# Patient Record
Sex: Female | Born: 1955 | Race: White | Hispanic: No | Smoking: Never smoker
Health system: Southern US, Community
[De-identification: ages and names within clinical notes are randomized; demographics above are authoritative.]

## PROBLEM LIST (undated history)

## (undated) DIAGNOSIS — K219 Gastro-esophageal reflux disease without esophagitis: Secondary | ICD-10-CM

## (undated) DIAGNOSIS — R519 Headache, unspecified: Secondary | ICD-10-CM

## (undated) DIAGNOSIS — C801 Malignant (primary) neoplasm, unspecified: Secondary | ICD-10-CM

## (undated) DIAGNOSIS — J349 Unspecified disorder of nose and nasal sinuses: Secondary | ICD-10-CM

## (undated) DIAGNOSIS — F32A Depression, unspecified: Secondary | ICD-10-CM

## (undated) DIAGNOSIS — R609 Edema, unspecified: Secondary | ICD-10-CM

## (undated) DIAGNOSIS — R233 Spontaneous ecchymoses: Secondary | ICD-10-CM

## (undated) DIAGNOSIS — R51 Headache: Secondary | ICD-10-CM

## (undated) DIAGNOSIS — H9190 Unspecified hearing loss, unspecified ear: Secondary | ICD-10-CM

## (undated) DIAGNOSIS — K259 Gastric ulcer, unspecified as acute or chronic, without hemorrhage or perforation: Secondary | ICD-10-CM

## (undated) DIAGNOSIS — F419 Anxiety disorder, unspecified: Secondary | ICD-10-CM

## (undated) DIAGNOSIS — R238 Other skin changes: Secondary | ICD-10-CM

## (undated) DIAGNOSIS — M791 Myalgia, unspecified site: Secondary | ICD-10-CM

## (undated) DIAGNOSIS — I10 Essential (primary) hypertension: Secondary | ICD-10-CM

## (undated) DIAGNOSIS — F329 Major depressive disorder, single episode, unspecified: Secondary | ICD-10-CM

## (undated) DIAGNOSIS — R1310 Dysphagia, unspecified: Secondary | ICD-10-CM

## (undated) DIAGNOSIS — M1990 Unspecified osteoarthritis, unspecified site: Secondary | ICD-10-CM

## (undated) HISTORY — DX: Gastric ulcer, unspecified as acute or chronic, without hemorrhage or perforation: K25.9

## (undated) HISTORY — DX: Dysphagia, unspecified: R13.10

## (undated) HISTORY — DX: Gastro-esophageal reflux disease without esophagitis: K21.9

## (undated) HISTORY — DX: Essential (primary) hypertension: I10

## (undated) HISTORY — DX: Spontaneous ecchymoses: R23.3

## (undated) HISTORY — DX: Myalgia, unspecified site: M79.10

## (undated) HISTORY — DX: Edema, unspecified: R60.9

## (undated) HISTORY — DX: Unspecified osteoarthritis, unspecified site: M19.90

## (undated) HISTORY — DX: Malignant (primary) neoplasm, unspecified: C80.1

## (undated) HISTORY — PX: HEMORRHOID SURGERY: SHX153

## (undated) HISTORY — DX: Headache, unspecified: R51.9

## (undated) HISTORY — DX: Unspecified disorder of nose and nasal sinuses: J34.9

## (undated) HISTORY — DX: Anxiety disorder, unspecified: F41.9

## (undated) HISTORY — PX: ABDOMINAL HYSTERECTOMY: SHX81

## (undated) HISTORY — PX: ESOPHAGUS SURGERY: SHX626

## (undated) HISTORY — DX: Other skin changes: R23.8

## (undated) HISTORY — DX: Headache: R51

## (undated) HISTORY — DX: Major depressive disorder, single episode, unspecified: F32.9

## (undated) HISTORY — DX: Unspecified hearing loss, unspecified ear: H91.90

## (undated) HISTORY — DX: Depression, unspecified: F32.A

## (undated) HISTORY — PX: CHOLECYSTECTOMY: SHX55

---

## 2002-06-03 ENCOUNTER — Inpatient Hospital Stay (HOSPITAL_COMMUNITY): Admission: EM | Admit: 2002-06-03 | Discharge: 2002-06-08 | Payer: Self-pay | Admitting: Psychiatry

## 2011-03-26 DEATH — deceased

## 2013-10-27 ENCOUNTER — Other Ambulatory Visit: Payer: Self-pay | Admitting: Internal Medicine

## 2013-10-27 DIAGNOSIS — M5412 Radiculopathy, cervical region: Secondary | ICD-10-CM

## 2013-11-03 ENCOUNTER — Other Ambulatory Visit: Payer: Self-pay

## 2014-03-30 DIAGNOSIS — M4726 Other spondylosis with radiculopathy, lumbar region: Secondary | ICD-10-CM | POA: Insufficient documentation

## 2014-03-30 DIAGNOSIS — I10 Essential (primary) hypertension: Secondary | ICD-10-CM | POA: Insufficient documentation

## 2014-03-30 DIAGNOSIS — M5412 Radiculopathy, cervical region: Secondary | ICD-10-CM | POA: Insufficient documentation

## 2014-03-30 DIAGNOSIS — M542 Cervicalgia: Secondary | ICD-10-CM | POA: Insufficient documentation

## 2014-03-30 DIAGNOSIS — K219 Gastro-esophageal reflux disease without esophagitis: Secondary | ICD-10-CM | POA: Insufficient documentation

## 2014-04-07 DIAGNOSIS — M545 Low back pain, unspecified: Secondary | ICD-10-CM | POA: Insufficient documentation

## 2014-04-07 DIAGNOSIS — F309 Manic episode, unspecified: Secondary | ICD-10-CM | POA: Insufficient documentation

## 2014-04-07 DIAGNOSIS — M76829 Posterior tibial tendinitis, unspecified leg: Secondary | ICD-10-CM | POA: Insufficient documentation

## 2014-04-07 DIAGNOSIS — G43909 Migraine, unspecified, not intractable, without status migrainosus: Secondary | ICD-10-CM | POA: Insufficient documentation

## 2014-04-07 DIAGNOSIS — IMO0002 Reserved for concepts with insufficient information to code with codable children: Secondary | ICD-10-CM | POA: Insufficient documentation

## 2014-06-06 DIAGNOSIS — M4722 Other spondylosis with radiculopathy, cervical region: Secondary | ICD-10-CM | POA: Insufficient documentation

## 2014-09-01 ENCOUNTER — Ambulatory Visit: Payer: Medicare HMO | Admitting: Podiatry

## 2014-09-01 ENCOUNTER — Ambulatory Visit (INDEPENDENT_AMBULATORY_CARE_PROVIDER_SITE_OTHER): Payer: Medicare HMO

## 2014-09-01 VITALS — BP 164/102 | HR 93 | Resp 18

## 2014-09-01 DIAGNOSIS — M25572 Pain in left ankle and joints of left foot: Secondary | ICD-10-CM

## 2014-09-01 DIAGNOSIS — M7752 Other enthesopathy of left foot: Secondary | ICD-10-CM | POA: Diagnosis not present

## 2014-09-01 DIAGNOSIS — R52 Pain, unspecified: Secondary | ICD-10-CM | POA: Diagnosis not present

## 2014-09-01 DIAGNOSIS — S93402A Sprain of unspecified ligament of left ankle, initial encounter: Secondary | ICD-10-CM | POA: Diagnosis not present

## 2014-09-01 DIAGNOSIS — M7672 Peroneal tendinitis, left leg: Secondary | ICD-10-CM

## 2014-09-01 MED ORDER — TRIAMCINOLONE ACETONIDE 10 MG/ML IJ SUSP: 10.0000 mg | Freq: Once | INTRAMUSCULAR | Status: AC

## 2014-09-01 NOTE — Patient Instructions (Signed)
ICE INSTRUCTIONS  Apply ice or cold pack to the affected area at least 3 times a day for 10-15 minutes each time.  You should also use ice after prolonged activity or vigorous exercise.  Do not apply ice longer than 20 minutes at one time.  Always keep a cloth between your skin and the ice pack to prevent burns.  Being consistent and following these instructions will help control your symptoms.  We suggest you purchase a gel ice pack because they are reusable and do bit leak.  Some of them are designed to wrap around the area.  Use the method that works best for you.  Here are some other suggestions for icing.   Use a frozen bag of peas or corn-inexpensive and molds well to your body, usually stays frozen for 10 to 20 minutes.  Wet a towel with cold water and squeeze out the excess until it's damp.  Place in a bag in the freezer for 20 minutes. Then remove and use.   Alternate applying a high compress and ice pack to the ankle 2 or 3 times every evening repeat heat for 10 minutes then ice for 10 minutes heat and ice again

## 2014-09-01 NOTE — Progress Notes (Signed)
Subjective:    Patient ID: Misty Roberts, female    DOB: 11-Nov-1955, 59 y.o.   MRN: [id number]  HPI I AM HAVING SOME ANKLE PAIN AND STARTED IN EARLY November AND HAS SWELLING AND PAIN GOING UP AND DOWN AND HAVE USED ICE AND HEAT NO INJURY AND BURNS AND THROBS,STINGS AND FEELS LIKE I AM NOT WALKING IN THE RIGHT POSITION AND FEELS TWISTED AND HAD EX-RAYS DONE AT [place] YESTERDAY    Review of Systems  Constitutional: Positive for activity change.  HENT: Positive for hearing loss.   Eyes: Positive for visual disturbance.  Cardiovascular:       CALF PAIN WITH WALKING   Gastrointestinal: Positive for constipation and blood in stool.       BLOATING   Musculoskeletal: Positive for back pain and gait problem.       JOINT PAIN   Neurological: Positive for dizziness.  Psychiatric/Behavioral: Positive for confusion. The patient is nervous/anxious.   All other systems reviewed and are negative.      Objective:   Physical Exam 59 year old white female well-developed well-nourished oriented 3 presents at this time with a history of chronic or recalcitrant left ankle pain. Describes sharp and shooting pain at times tight feeling around the lateral malleoli are lateral ankle extending to the fifth metatarsal base. She's using the ice does remember any injury however has burning and tingling stinging and throbbing sensations that time previous x-rays were also likely unremarkable. Lower extremity objective findings as follows vascular status is intact pedal pulses palpable DP +2 over 4 bilateral PT plus one over 4 bilateral capillary refill time 3 seconds all digits epicritic and proprioceptive sensations intact and symmetric bilateral there is normal plantar response and DTRs. Dermatologically skin color pigment normal hair growth absent nails criptotic incurvated otherwise unremarkable there is some pain tenderness on palpation lateral sinus tarsi area lateral malleolar area of the left  foot right foot is a symptomatically unremarkable there is posse some soft tissue swelling in the sinus tarsi on palpation tenderness along the anterior talofibular and calcaneofibular ligament as well as along the peroneal tendon distribution down to its insertion in the base the fifth. X-rays reveal no fracture no signs of osseous abnormality no cyst or tumors subtalar joint intact posterior middle facet on lateral projection AP and mortise ankle views unremarkable symmetric joint spaces are noted no fractures no cysts no tumors no erosions or identified. Cannot rule out soft tissue injury. Patient denies any ankle sprain however is wearing a pair flimsy worn out shoes and likely has not been having a stable shoe may have turned her ankle in the recent past. It does feel like it's twisted according to the patient       Assessment & Plan:  Assessment this time is capsulitis and sinus tarsitis lateral sinus tarsi and subtalar joint left foot the ankle mortise itself is not painful is more in the sinus tarsi area also at this time injection tender with Kenalog 20 mg Xylocaine plain infiltrated the sinus tarsi subtalar joint left foot and ankle under topical anesthetic applied ethyl chloride injection was delivered to the sinus tarsi with minimal discomfort patient does have pain on inversion eversion in particular plantar flexion and inversion of the ankle and sinus tarsi area patient this time is been placed in air fracture third ankle stabilizer to help provide some lateral ankle stability maintain this for at least 4-5 weeks follow-up with in one month for reevaluation and check for progress.  She is advised to good stable shoe such as new balance Brooks or ASICS no barefoot no flimsy shoes no flip-flops recommend crocs for around the house as far as pain patient does have Tylenol and access to tramadol which was recently prescribed for her. Assessment 1 month  Harriet Masson DPM

## 2014-10-06 ENCOUNTER — Ambulatory Visit (INDEPENDENT_AMBULATORY_CARE_PROVIDER_SITE_OTHER): Payer: Medicare HMO

## 2014-10-06 VITALS — BP 114/88 | HR 69 | Resp 12

## 2014-10-06 DIAGNOSIS — M7752 Other enthesopathy of left foot: Secondary | ICD-10-CM

## 2014-10-06 DIAGNOSIS — M25572 Pain in left ankle and joints of left foot: Secondary | ICD-10-CM

## 2014-10-06 DIAGNOSIS — S93402A Sprain of unspecified ligament of left ankle, initial encounter: Secondary | ICD-10-CM

## 2014-10-06 DIAGNOSIS — R52 Pain, unspecified: Secondary | ICD-10-CM

## 2014-10-06 DIAGNOSIS — M7672 Peroneal tendinitis, left leg: Secondary | ICD-10-CM

## 2014-10-06 MED ORDER — MELOXICAM 15 MG PO TABS: 15.0000 mg | ORAL_TABLET | Freq: Every day | ORAL | Status: DC

## 2014-10-06 NOTE — Progress Notes (Signed)
   Subjective:    Patient ID: Misty Roberts, female    DOB: Nov 23, 1955, 59 y.o.   MRN: [id number]  HPI  ''lt foot ankle is still painful and have burning sensation.''  Review of Systems no new findings or systemic changes noted     Objective:   Physical Exam Neurovascular status is intact pedal pulses are palpable patient is currently pulling sensation in the lateral ankle along the peroneal tendon and 10  Insertion still tender as is the sinus tarsi area. Likely chronic ankle sprain ankle instability type situation at this time patient also may have some neuritis or neuralgia which is contributing to symptoms however this time a recommendation is to use an ankle stabilizer when needed or maintain a good shoe currently wearing a very flimsy type shoe recommended a lace up athletic or walking shoe at all times consider even a high top type shoe. Per prescription for meloxicam is issued at this time and my recommendation is to consider physical therapy to help strengthen the ankle and tendon and peroneal tendons.       Assessment & Plan:  Assessment capsulitis lateral ankle sinus tarsitis with the peroneal tendinitis chronic ankle instability. Plan at this time patient is sent for physical therapy for 3 times a week for at least 3 weeks also will continue with ankle stabilizer as needed prescription for meloxicam is given for any breakthrough pain follow-up within 1-2 months for additional evaluation if not improved are resolving next  Harriet Masson DPM

## 2014-10-06 NOTE — Patient Instructions (Signed)
ICE INSTRUCTIONS  Apply ice or cold pack to the affected area at least 3 times a day for 10-15 minutes each time.  You should also use ice after prolonged activity or vigorous exercise.  Do not apply ice longer than 20 minutes at one time.  Always keep a cloth between your skin and the ice pack to prevent burns.  Being consistent and following these instructions will help control your symptoms.  We suggest you purchase a gel ice pack because they are reusable and do bit leak.  Some of them are designed to wrap around the area.  Use the method that works best for you.  Here are some other suggestions for icing.   Use a frozen bag of peas or corn-inexpensive and molds well to your body, usually stays frozen for 10 to 20 minutes.  Wet a towel with cold water and squeeze out the excess until it's damp.  Place in a bag in the freezer for 20 minutes. Then remove and use.  Maintain ankle stabilizer or high top type shoe. Follow-up with physical therapy as prescribed

## 2014-12-07 ENCOUNTER — Ambulatory Visit (INDEPENDENT_AMBULATORY_CARE_PROVIDER_SITE_OTHER): Payer: Medicare HMO

## 2014-12-07 VITALS — BP 145/86 | HR 80 | Resp 18

## 2014-12-07 DIAGNOSIS — M25572 Pain in left ankle and joints of left foot: Secondary | ICD-10-CM

## 2014-12-07 DIAGNOSIS — M7752 Other enthesopathy of left foot: Secondary | ICD-10-CM

## 2014-12-07 DIAGNOSIS — M25372 Other instability, left ankle: Secondary | ICD-10-CM

## 2014-12-07 MED ORDER — MELOXICAM 15 MG PO TABS: 15.0000 mg | ORAL_TABLET | Freq: Every day | ORAL | Status: DC

## 2014-12-07 NOTE — Progress Notes (Signed)
Chief Complaint  Patient presents with  . Foot Problem    MY LEFT ANKLE IS DOING ALOT BETTER AND THE MEDICINE HAS HELPED      HPI: Patient is 59 y.o. female who presents today for follow up left ankle pain.  She relates the pain has proved with physical therapy and with the meloxicam medication. She states the pain is still present but it's much better.   No Known Allergies  Physical Exam  Her vascular status is unchanged. Lateral ankle ankle pain still present along the peroneal tendons and there is some swelling along the distal portion of the peroneus brevis tendon at its insertion. Continued pain along the sinus tarsi region is also noted. Pain along the anterior talofibular ligament and calcaneal fibular ligament is also noted with palpation. Chronic ankle instability favored.  Assessment: Sinus tarsi syndrome, chronic lateral ankle instability, peroneal tendon pain   Plan: Injected the sinus tarsi for injection #2 with dexamethasone and Marcaine plain under sterile technique and without complication. Recommended continued physical therapy. Recommended continued use of her ankle stabilizing brace. Refilled her meloxicam. Instructed her to take this with a full meal. She will finish out physical therapy and if there is no improvement at that time she will call. In the future she may be candidate for an ankle stabilization repair and we would get an MRI prior to considering this procedure.

## 2014-12-07 NOTE — Patient Instructions (Signed)
Continue out your physical therapy-- if it is not getting better at that point, please call

## 2014-12-08 ENCOUNTER — Ambulatory Visit: Payer: Medicare HMO

## 2015-03-30 DIAGNOSIS — M25819 Other specified joint disorders, unspecified shoulder: Secondary | ICD-10-CM | POA: Insufficient documentation

## 2015-03-30 DIAGNOSIS — M19011 Primary osteoarthritis, right shoulder: Secondary | ICD-10-CM | POA: Insufficient documentation

## 2017-01-29 DIAGNOSIS — M25521 Pain in right elbow: Secondary | ICD-10-CM | POA: Insufficient documentation

## 2017-01-29 DIAGNOSIS — B999 Unspecified infectious disease: Secondary | ICD-10-CM | POA: Insufficient documentation

## 2017-02-24 DIAGNOSIS — R9431 Abnormal electrocardiogram [ECG] [EKG]: Secondary | ICD-10-CM

## 2018-12-06 DIAGNOSIS — R002 Palpitations: Secondary | ICD-10-CM | POA: Insufficient documentation

## 2018-12-06 DIAGNOSIS — R0789 Other chest pain: Secondary | ICD-10-CM | POA: Insufficient documentation

## 2018-12-06 DIAGNOSIS — R0609 Other forms of dyspnea: Secondary | ICD-10-CM | POA: Insufficient documentation

## 2018-12-06 DIAGNOSIS — R55 Syncope and collapse: Secondary | ICD-10-CM | POA: Insufficient documentation

## 2019-02-28 ENCOUNTER — Encounter: Payer: Self-pay | Admitting: Neurology

## 2019-03-30 NOTE — Progress Notes (Addendum)
NEUROLOGY CONSULTATION NOTE  Misty Roberts MRN: [id number] DOB: 1956-06-04  Referring provider: Gardiner Rhyme, MD Primary care provider: [facility]  Reason for consult:  vertigo  HISTORY OF PRESENT ILLNESS: Misty Roberts is a 63 year old female with hypertension, chronic pain, sleep apnea intolerant to CPAP) and Bipolar disorder who presents for vertigo.  History supplemented by referring provider note.  Over the past 3 years, she reports episodes of dizziness.  Some are more severe than others.  It's positional.  If she bends over or look up, or laying down in bed, she may experience a spinning sensation, lasting a few seconds.  She has associated nausea.  No associated vomiting, headache, double vision, slurred speech or unilateral numbness or weakness.  It is off and on but the sensation of some type of dizziness is chronic.  No falls.  She has had episodes of palpitations, pounding in her chest, and lightheadedness.  Often the dizziness may occur following these spells.  She had on episode of vertigo in March that caused her to pass out.  She also has endorsed dyspnea on exertion with sensation of chest tightness and sometimes becomes short of breath just walking up the stairs.  She was evaluated by cardiology and underwent cardiac workup.  TTE was normal with LV EF 60-65%.  Nuclear stress test and 14 day Zio patch but will not know the results until she follows up with cardiology.  She has been to vestibular rehab, however she has difficulty performing the Epley maneuver due to chronic neck pain.  PAST MEDICAL HISTORY: Past Medical History:  Diagnosis Date  . Anxiety   . Arthritis   . Bruises easily   . Cancer   . Depression   . Difficulty swallowing   . Frequent headaches   . GERD (gastroesophageal reflux disease)   . Hearing loss   . Hypertension   . Muscle pain   . Sinus problem   . Stomach ulcer   . Swelling    FEET    PAST SURGICAL HISTORY: Past  Surgical History:  Procedure Laterality Date  . ESOPHAGUS SURGERY      MEDICATIONS: Current Outpatient Medications on File Prior to Visit  Medication Sig Dispense Refill  . AMITIZA 24 MCG capsule     . amLODipine (NORVASC) 10 MG tablet     . amoxicillin-clavulanate (AUGMENTIN) 875-125 MG per tablet     . azithromycin (ZITHROMAX) 250 MG tablet     . BRINTELLIX 10 MG TABS     . buPROPion (WELLBUTRIN XL) 150 MG 24 hr tablet     . celecoxib (CELEBREX) 200 MG capsule 200 mg.    . Cholecalciferol (VITAMIN D3) 2000 UNITS capsule Take by mouth.    . clarithromycin (BIAXIN XL) 500 MG 24 hr tablet     . cyclobenzaprine (FLEXERIL) 10 MG tablet Take 10 mg by mouth.    . escitalopram (LEXAPRO) 10 MG tablet     . escitalopram (LEXAPRO) 20 MG tablet     . furosemide (LASIX) 20 MG tablet     . gabapentin (NEURONTIN) 100 MG capsule Take 1 to 3 tablets at night as needed for radicular pain.    Marland Kitchen HYDROcodone-acetaminophen (NORCO/VICODIN) 5-325 MG per tablet Take by mouth.    Marland Kitchen HYDROcodone-ibuprofen (VICOPROFEN) 7.5-200 MG per tablet Take as directed.    Marland Kitchen lisinopril (PRINIVIL,ZESTRIL) 10 MG tablet Take 10 mg by mouth.    Marland Kitchen lisinopril-hydrochlorothiazide (PRINZIDE,ZESTORETIC) 20-25 MG per tablet     .  LORazepam (ATIVAN) 1 MG tablet Take 1 mg by mouth.    Marland Kitchen LORazepam (ATIVAN) 1 MG tablet     . meloxicam (MOBIC) 15 MG tablet Take 1 tablet (15 mg total) by mouth daily. 30 tablet 1  . Omega-3 Fatty Acids (FISH OIL) 1000 MG CAPS Take 1,200 mg by mouth.    Marland Kitchen omeprazole (PRILOSEC) 20 MG capsule Take 10 mg by mouth.    Marland Kitchen omeprazole (PRILOSEC) 20 MG capsule     . predniSONE (DELTASONE) 10 MG tablet     . simvastatin (ZOCOR) 10 MG tablet 40 mg.    . simvastatin (ZOCOR) 40 MG tablet     . traZODone (DESYREL) 100 MG tablet     . triamcinolone cream (KENALOG) 0.1 %     . VESICARE 5 MG tablet      Current Facility-Administered Medications on File Prior to Visit  Medication Dose Route Frequency Provider Last  Rate Last Dose  . triamcinolone acetonide (KENALOG) 10 MG/ML injection 10 mg  10 mg Other Once Harriet Masson, DPM        ALLERGIES: No Known Allergies  FAMILY HISTORY: Family History  Problem Relation Age of Onset  . Bone cancer Mother   . Congestive Heart Failure Father   . Prostate cancer Father   . Dementia Father   . Lung cancer Sister   . Diabetes Brother   . Coronary artery disease Brother   . Pneumonia Sister   . Hypertension Sister   . High Cholesterol Sister   . Hypertension Sister   . High Cholesterol Sister   . Hypertension Sister   . High Cholesterol Sister   . Hypertension Sister   . High Cholesterol Sister   . Hypertension Sister   . High Cholesterol Sister   . Atrial fibrillation Brother   . Heart attack Brother     SOCIAL HISTORY: Social History   Socioeconomic History  . Marital status: Married    Spouse name: Not on file  . Number of children: Not on file  . Years of education: Not on file  . Highest education level: Not on file  Occupational History  . Not on file  Social Needs  . Financial resource strain: Not on file  . Food insecurity    Worry: Not on file    Inability: Not on file  . Transportation needs    Medical: Not on file    Non-medical: Not on file  Tobacco Use  . Smoking status: Never Smoker  . Smokeless tobacco: Never Used  Substance and Sexual Activity  . Alcohol use: No    Alcohol/week: 0.0 standard drinks  . Drug use: No  . Sexual activity: Not on file  Lifestyle  . Physical activity    Days per week: Not on file    Minutes per session: Not on file  . Stress: Not on file  Relationships  . Social Herbalist on phone: Not on file    Gets together: Not on file    Attends religious service: Not on file    Active member of club or organization: Not on file    Attends meetings of clubs or organizations: Not on file    Relationship status: Not on file  . Intimate partner violence    Fear of current or ex  partner: Not on file    Emotionally abused: Not on file    Physically abused: Not on file    Forced sexual activity: Not on  file  Other Topics Concern  . Not on file  Social History Narrative  . Not on file    REVIEW OF SYSTEMS: Constitutional: No fevers, chills Eyes: No visual changes, double vision, eye pain Ear, nose and throat: No hearing loss, ear pain, nasal congestion, sore throat Cardiovascular: sometimes chest discomfort, palpitations Respiratory:  shortness of breath at rest or with exertion, GastrointestinaI: No nausea, vomiting, diarrhea, abdominal pain, fecal incontinence Genitourinary:  No dysuria, urinary retention or frequency Musculoskeletal:  No neck pain, back pain Integumentary: No rash, pruritus, skin lesions Neurological: as above Psychiatric: No depression, insomnia, anxiety Endocrine: palpitations, fatigue, diaphoresisechiae. Allergic/Immunologic: no itchy/runny eyes, nasal congestion, recent allergic reactions, rashes  PHYSICAL EXAM: Blood pressure 95/63, pulse 75, temperature 98.7 F (37.1 C), temperature source Oral, height 4\' 9"  (1.448 m), weight 136 lb (61.7 kg), SpO2 100 %. General: No acute distress.  Patient appears well-groomed.   Head:  Normocephalic/atraumatic Eyes:  fundi examined but not visualized Neck: supple, no paraspinal tenderness, full range of motion Back: No paraspinal tenderness Heart: regular rate and rhythm Lungs: Clear to auscultation bilaterally. Vascular: No carotid bruits. Neurological Exam: Mental status: alert and oriented to person, place, and time, recent and remote memory intact, fund of knowledge intact, attention and concentration intact, speech fluent and not dysarthric, language intact. Cranial nerves: CN I: not tested CN II: pupils equal, round and reactive to light, visual fields intact CN III, IV, VI:  full range of motion, no nystagmus, no ptosis CN V: facial sensation intact CN VII: upper and lower face  symmetric CN VIII: hearing intact CN IX, X: gag intact, uvula midline CN XI: sternocleidomastoid and trapezius muscles intact CN XII: tongue midline Bulk & Tone: normal, no fasciculations. Motor:  5/5 throughout  Sensation:  temperature and vibration sensation intact.   Deep Tendon Reflexes:  2+ throughout, toes downgoing.  Finger to nose testing:  Without dysmetria.  Heel to shin:  Without dysmetria.   Gait:  Normal station and stride.  Able to turn and tandem walk. Romberg negative.  IMPRESSION: Positional vertigo, semiology consistent with BPPV.  No focal/laterazing associated symptoms.  She also has had palpitations and lightheadedness, which may be contributing.  Blood pressure is low.  Symptoms are chronic and therefore not likely secondary to a CVA.  However, given the severity of some of these spells, I would like to rule out vertebrobasilar insufficiency.  PLAN: CTA of head and neck.  If unremarkable, no further recommendations.  04/15/19 ADDENDUM:  CTA of head and neck unremarkable.  Thank you for allowing me to take part in the care of this patient.  Metta Clines, DO

## 2019-03-31 ENCOUNTER — Encounter: Payer: Self-pay | Admitting: Neurology

## 2019-03-31 ENCOUNTER — Ambulatory Visit (INDEPENDENT_AMBULATORY_CARE_PROVIDER_SITE_OTHER): Payer: Medicare HMO | Admitting: Neurology

## 2019-03-31 ENCOUNTER — Other Ambulatory Visit: Payer: Self-pay

## 2019-03-31 VITALS — BP 95/63 | HR 75 | Temp 98.7°F | Ht <= 58 in | Wt 136.0 lb

## 2019-03-31 DIAGNOSIS — R42 Dizziness and giddiness: Secondary | ICD-10-CM | POA: Diagnosis not present

## 2019-03-31 DIAGNOSIS — R55 Syncope and collapse: Secondary | ICD-10-CM | POA: Diagnosis not present

## 2019-03-31 NOTE — Patient Instructions (Addendum)
Dizziness likely related to inner ear issues and possibly low blood pressure.  However, we will check the brain and blood vessels (CTA of head and neck) to look for any other potential cause.  Further recommendations pending results.  We have sent a referral to [facility] for your CTA's and they will call you directly to schedule your appointment.   They are located at [place]. If you need to contact them, or you have not received a call to schedule an appointment within 5-7 days,  please call [phone].

## 2019-04-04 DIAGNOSIS — I950 Idiopathic hypotension: Secondary | ICD-10-CM | POA: Insufficient documentation

## 2019-04-15 ENCOUNTER — Ambulatory Visit
Admission: RE | Admit: 2019-04-15 | Discharge: 2019-04-15 | Disposition: A | Discharge status: Home / Self Care | Payer: Medicare HMO | Source: Ambulatory Visit | Attending: Neurology | Admitting: Neurology

## 2019-04-15 ENCOUNTER — Other Ambulatory Visit: Payer: Self-pay

## 2019-04-15 DIAGNOSIS — R55 Syncope and collapse: Secondary | ICD-10-CM

## 2019-04-15 DIAGNOSIS — R42 Dizziness and giddiness: Secondary | ICD-10-CM

## 2019-04-15 MED ORDER — IOPAMIDOL (ISOVUE-370) INJECTION 76%
75.0000 mL | Freq: Once | INTRAVENOUS | Status: AC | PRN
  Administered 2019-04-15: 75 mL via INTRAVENOUS

## 2019-04-18 ENCOUNTER — Telehealth: Payer: Self-pay

## 2019-04-18 NOTE — Telephone Encounter (Signed)
-----   Message from Pieter Partridge, DO sent at 04/15/2019  7:01 PM EDT ----- CTA of head and neck unremarkable.  No further recommendations from neurology.  Recommend following up with PCP regarding symptoms.

## 2019-04-18 NOTE — Telephone Encounter (Signed)
Called spoke with patient she was made aware of results. understands

## 2019-06-29 DIAGNOSIS — I2510 Atherosclerotic heart disease of native coronary artery without angina pectoris: Secondary | ICD-10-CM | POA: Insufficient documentation

## 2019-06-29 DIAGNOSIS — Z955 Presence of coronary angioplasty implant and graft: Secondary | ICD-10-CM | POA: Insufficient documentation

## 2019-06-29 DIAGNOSIS — E785 Hyperlipidemia, unspecified: Secondary | ICD-10-CM | POA: Insufficient documentation

## 2019-11-28 DIAGNOSIS — M7581 Other shoulder lesions, right shoulder: Secondary | ICD-10-CM | POA: Insufficient documentation

## 2020-02-03 DIAGNOSIS — R5381 Other malaise: Secondary | ICD-10-CM | POA: Insufficient documentation

## 2020-06-29 DIAGNOSIS — G4733 Obstructive sleep apnea (adult) (pediatric): Secondary | ICD-10-CM | POA: Insufficient documentation

## 2020-10-02 DIAGNOSIS — G5601 Carpal tunnel syndrome, right upper limb: Secondary | ICD-10-CM | POA: Insufficient documentation

## 2020-10-02 DIAGNOSIS — M79641 Pain in right hand: Secondary | ICD-10-CM | POA: Insufficient documentation

## 2021-08-18 IMAGING — CT CT ANGIOGRAPHY NECK
1 of 4 series · 2 of 16 positions shown · IV contrast (APPLIED)
Comparison: Brain MRI 02/23/2017

CLINICAL DATA: Near syncope and dizziness with elevated heart rate
for 5-6 months

EXAM:
CT ANGIOGRAPHY HEAD AND NECK
TECHNIQUE: Multidetector CT imaging of the head and neck was performed using
the standard protocol during bolus administration of intravenous
contrast. Multiplanar CT image reconstructions and MIPs were
obtained to evaluate the vascular anatomy. Carotid stenosis
measurements (when applicable) are obtained utilizing NASCET
criteria, using the distal internal carotid diameter as the
denominator.
CONTRAST:  75mL FO2ULB-RPL IOPAMIDOL (FO2ULB-RPL) INJECTION 76%

[Series 8: head/neck angio · axial · 0.46mm/px · z∈[-246,-142]mm · 2 of 158 slices shown]
[im 53/158  soft-tissue]
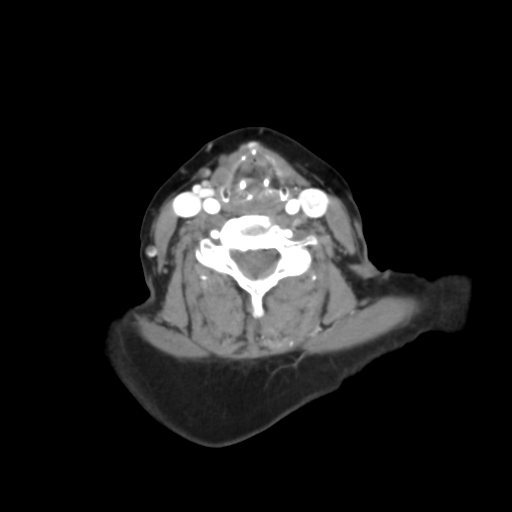
[im 105/158  bone]
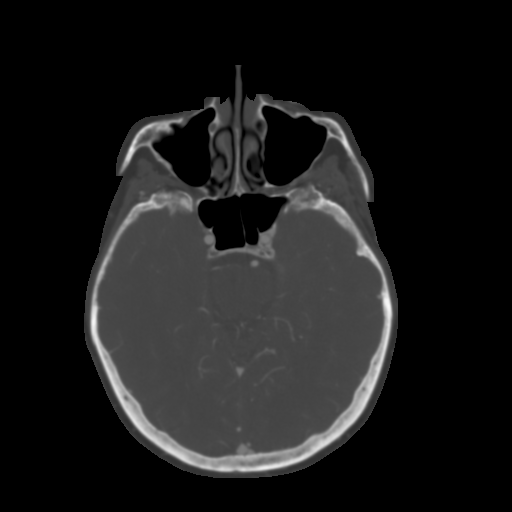

[2 of 16 positions shown; findings below may reference images not displayed]

FINDINGS: CT HEAD FINDINGS

Brain: No evidence of acute infarction, hemorrhage, hydrocephalus,
extra-axial collection or mass lesion/mass effect.

Vascular: See below

Skull: Negative

Sinuses: Negative

Orbits: Negative

Review of the MIP images confirms the above findings

CTA NECK FINDINGS

Aortic arch: Normal.  Three vessel branching

Right carotid system: Significant motion artifact at the level of
the proximal ICA. No stenosis or ulceration. Negative for beading.

Left carotid system: Significant artifact at the level of the
proximal ICA. Mild atherosclerotic plaque, calcified, at the bulb.
No stenosis or ulceration. Negative for beading.

Vertebral arteries: No proximal subclavian stenosis. Both vertebral
arteries are smooth and widely patent to the dura.

Skeleton: No acute or aggressive finding

Other neck: Negative for mass or inflammation

Upper chest: Calcified granuloma in the right upper lobe. No acute
finding

Review of the MIP images confirms the above findings

CTA HEAD FINDINGS

Anterior circulation: Mild atherosclerotic plaque at the left
carotid siphon. No branch occlusion, significant stenosis, or
aneurysm.

Posterior circulation: Codominant vertebral arteries. The vertebral
and basilar arteries are smooth and diffusely patent. Aplastic right
P1 segment. No branch occlusion or flow limiting stenosis.

Venous sinuses: Diffusely patent

Anatomic variants: As above

Review of the MIP images confirms the above findings
IMPRESSION: 1. No emergent finding or vertebrobasilar stenosis.
2. Mild atherosclerosis.
3. Patient motion at the level of the proximal ICAs.

## 2022-01-13 ENCOUNTER — Ambulatory Visit (INDEPENDENT_AMBULATORY_CARE_PROVIDER_SITE_OTHER): Payer: Medicare HMO | Admitting: Podiatry

## 2022-01-13 ENCOUNTER — Encounter: Payer: Self-pay | Admitting: Podiatry

## 2022-01-13 ENCOUNTER — Ambulatory Visit (INDEPENDENT_AMBULATORY_CARE_PROVIDER_SITE_OTHER): Payer: Medicare HMO

## 2022-01-13 DIAGNOSIS — H8110 Benign paroxysmal vertigo, unspecified ear: Secondary | ICD-10-CM | POA: Insufficient documentation

## 2022-01-13 DIAGNOSIS — J449 Chronic obstructive pulmonary disease, unspecified: Secondary | ICD-10-CM | POA: Insufficient documentation

## 2022-01-13 DIAGNOSIS — E049 Nontoxic goiter, unspecified: Secondary | ICD-10-CM | POA: Insufficient documentation

## 2022-01-13 DIAGNOSIS — M75111 Incomplete rotator cuff tear or rupture of right shoulder, not specified as traumatic: Secondary | ICD-10-CM | POA: Insufficient documentation

## 2022-01-13 DIAGNOSIS — N3090 Cystitis, unspecified without hematuria: Secondary | ICD-10-CM | POA: Insufficient documentation

## 2022-01-13 DIAGNOSIS — M722 Plantar fascial fibromatosis: Secondary | ICD-10-CM | POA: Diagnosis not present

## 2022-01-13 DIAGNOSIS — J301 Allergic rhinitis due to pollen: Secondary | ICD-10-CM | POA: Insufficient documentation

## 2022-01-13 DIAGNOSIS — R4182 Altered mental status, unspecified: Secondary | ICD-10-CM | POA: Insufficient documentation

## 2022-01-13 DIAGNOSIS — A4902 Methicillin resistant Staphylococcus aureus infection, unspecified site: Secondary | ICD-10-CM | POA: Insufficient documentation

## 2022-01-13 DIAGNOSIS — E039 Hypothyroidism, unspecified: Secondary | ICD-10-CM | POA: Insufficient documentation

## 2022-01-13 DIAGNOSIS — E559 Vitamin D deficiency, unspecified: Secondary | ICD-10-CM | POA: Insufficient documentation

## 2022-01-13 DIAGNOSIS — I714 Abdominal aortic aneurysm, without rupture, unspecified: Secondary | ICD-10-CM | POA: Insufficient documentation

## 2022-01-13 DIAGNOSIS — F330 Major depressive disorder, recurrent, mild: Secondary | ICD-10-CM | POA: Insufficient documentation

## 2022-01-13 DIAGNOSIS — G629 Polyneuropathy, unspecified: Secondary | ICD-10-CM | POA: Insufficient documentation

## 2022-01-13 DIAGNOSIS — F411 Generalized anxiety disorder: Secondary | ICD-10-CM | POA: Insufficient documentation

## 2022-01-13 DIAGNOSIS — C44721 Squamous cell carcinoma of skin of unspecified lower limb, including hip: Secondary | ICD-10-CM | POA: Insufficient documentation

## 2022-01-13 DIAGNOSIS — Z85828 Personal history of other malignant neoplasm of skin: Secondary | ICD-10-CM | POA: Insufficient documentation

## 2022-01-13 DIAGNOSIS — G4700 Insomnia, unspecified: Secondary | ICD-10-CM | POA: Insufficient documentation

## 2022-01-13 DIAGNOSIS — R5382 Chronic fatigue, unspecified: Secondary | ICD-10-CM | POA: Insufficient documentation

## 2022-01-13 DIAGNOSIS — E8801 Alpha-1-antitrypsin deficiency: Secondary | ICD-10-CM | POA: Insufficient documentation

## 2022-01-13 DIAGNOSIS — D518 Other vitamin B12 deficiency anemias: Secondary | ICD-10-CM | POA: Insufficient documentation

## 2022-01-13 DIAGNOSIS — Z86711 Personal history of pulmonary embolism: Secondary | ICD-10-CM | POA: Insufficient documentation

## 2022-01-13 DIAGNOSIS — I6529 Occlusion and stenosis of unspecified carotid artery: Secondary | ICD-10-CM | POA: Insufficient documentation

## 2022-01-13 DIAGNOSIS — I479 Paroxysmal tachycardia, unspecified: Secondary | ICD-10-CM | POA: Insufficient documentation

## 2022-01-13 DIAGNOSIS — E119 Type 2 diabetes mellitus without complications: Secondary | ICD-10-CM | POA: Insufficient documentation

## 2022-01-13 DIAGNOSIS — N182 Chronic kidney disease, stage 2 (mild): Secondary | ICD-10-CM | POA: Insufficient documentation

## 2022-01-13 DIAGNOSIS — I4891 Unspecified atrial fibrillation: Secondary | ICD-10-CM | POA: Insufficient documentation

## 2022-01-13 DIAGNOSIS — E785 Hyperlipidemia, unspecified: Secondary | ICD-10-CM | POA: Insufficient documentation

## 2022-01-13 MED ORDER — DEXAMETHASONE SODIUM PHOSPHATE 120 MG/30ML IJ SOLN
4.0000 mg | Freq: Once | INTRAMUSCULAR | Status: AC
  Administered 2022-01-13: 4 mg via INTRA_ARTICULAR

## 2022-01-13 NOTE — Progress Notes (Signed)
  Subjective:  Patient ID: Misty Roberts, female    DOB: [birth year],   MRN: [id number]  No chief complaint on file.   66 y.o. female presents for concern of left heel pain that has been going on for about 5 months. Relates it has caused pain going up her leg and limping. Relates ankle pain as well. Hurts when sitting and changing shoes. She relates soreness numbness and burning in the leg. Denies any treatments . Also relates concern of right foot bunion and toe curving.  Denies any other pedal complaints. Denies n/v/f/c.   Past Medical History:  Diagnosis Date   Anxiety    Arthritis    Bruises easily    Cancer ([place])    Depression    Difficulty swallowing    Frequent headaches    GERD (gastroesophageal reflux disease)    Hearing loss    Hypertension    Muscle pain    Sinus problem    Stomach ulcer    Swelling    FEET    Objective:  Physical Exam: Vascular: DP/PT pulses 2/4 bilateral. CFT <3 seconds. Normal hair growth on digits. No edema.  Skin. No lacerations or abrasions bilateral feet.  Musculoskeletal: MMT 5/5 bilateral lower extremities in DF, PF, Inversion and Eversion. Deceased ROM in DF of ankle joint. Tender to medial calcaneal tubercle as well as lateral plantar heel. Some pain around achilles tendon. No pain along PT or arch.  Neurological: Sensation intact to light touch.   Assessment:   1. Plantar fasciitis of left foot      Plan:  Patient was evaluated and treated and all questions answered. Discussed plantar fasciitis with patient. Discussed some pain could be coming from back degenerative disc disease.  X-rays reviewed and discussed with patient. No acute fractures or dislocations noted. Mild spurring noted at inferior calcaneus.  Discussed treatment options including, ice, NSAIDS, supportive shoes, bracing, and stretching. Stretching exercises provided to be done on a daily basis.   Unable to take NSAIDs.  Patient requesting injection today. Procedure  note below.   Follow-up 6 weeks or sooner if any problems arise. In the meantime, encouraged to call the office with any questions, concerns, change in symptoms.   Procedure:  Discussed etiology, pathology, conservative vs. surgical therapies. At this time a plantar fascial injection was recommended.  The patient agreed and a sterile skin prep was applied.  An injection consisting of  dexamethasone and marcaine mixture was infiltrated at the point of maximal tenderness on the left Heel.  Bandaid applied. The patient tolerated this well and was given instructions for aftercare.     Lorenda Peck, DPM

## 2022-01-13 NOTE — Patient Instructions (Signed)

## 2022-03-03 ENCOUNTER — Encounter: Payer: Self-pay | Admitting: Podiatry

## 2022-03-03 ENCOUNTER — Ambulatory Visit (INDEPENDENT_AMBULATORY_CARE_PROVIDER_SITE_OTHER): Payer: Medicare HMO | Admitting: Podiatry

## 2022-03-03 DIAGNOSIS — M722 Plantar fascial fibromatosis: Secondary | ICD-10-CM

## 2022-03-03 MED ORDER — DEXAMETHASONE SODIUM PHOSPHATE 120 MG/30ML IJ SOLN
4.0000 mg | Freq: Once | INTRAMUSCULAR | Status: AC
  Administered 2022-03-03: 4 mg via INTRA_ARTICULAR

## 2022-03-03 NOTE — Progress Notes (Signed)
  Subjective:  Patient ID: Misty Roberts, female    DOB: 1955-10-18,   MRN: [id number]  Chief Complaint  Patient presents with   Plantar Fasciitis    66 y.o. female presents for follow-up of left plantar fasciiits. Relates it is doing better maybe about 50% or more. But states now the right foot has been acting up and hurting. States she has not been stretching as she should but injection did help and requesting another today. She does have a history of diabetic neuropathy.  Denies any other pedal complaints. Denies n/v/f/c.   Past Medical History:  Diagnosis Date   Anxiety    Arthritis    Bruises easily    Cancer ([place])    Depression    Difficulty swallowing    Frequent headaches    GERD (gastroesophageal reflux disease)    Hearing loss    Hypertension    Muscle pain    Sinus problem    Stomach ulcer    Swelling    FEET    Objective:  Physical Exam: Vascular: DP/PT pulses 2/4 bilateral. CFT <3 seconds. Normal hair growth on digits. No edema.  Skin. No lacerations or abrasions bilateral feet.  Musculoskeletal: MMT 5/5 bilateral lower extremities in DF, PF, Inversion and Eversion. Deceased ROM in DF of ankle joint. Tender to medial calcaneal tubercle as well as lateral plantar heel. Some pain around achilles tendon. No pain along PT or arch. More pain now on the right than the left.  Neurological: Sensation intact to light touch.   Assessment:   No diagnosis found.    Plan:  Patient was evaluated and treated and all questions answered. Discussed plantar fasciitis with patient. Discussed some pain could be coming from back degenerative disc disease.  X-rays reviewed and discussed with patient. No acute fractures or dislocations noted. Mild spurring noted at inferior calcaneus.  Discussed treatment options including, ice, NSAIDS, supportive shoes, bracing, and stretching. Stretching exercises provided to be done on a daily basis.   Unable to take NSAIDs.  Injection for  the right foot requested. Procedure below.  Follow-up 6 weeks or sooner if any problems arise. In the meantime, encouraged to call the office with any questions, concerns, change in symptoms.   Procedure: Injection Tendon/Ligament Discussed alternatives, risks, complications and verbal consent was obtained.  Location: Right plantar fascia . Skin Prep: Alcohol. Injectate: 1cc 0.5% marcaine plain, 1 cc dexamethasone.  Disposition: Patient tolerated procedure well. Injection site dressed with a Roberts-aid.  Post-injection care was discussed and return precautions discussed.    Lorenda Peck, DPM

## 2022-05-30 ENCOUNTER — Ambulatory Visit (INDEPENDENT_AMBULATORY_CARE_PROVIDER_SITE_OTHER): Payer: Medicare HMO | Admitting: Podiatry

## 2022-05-30 DIAGNOSIS — M722 Plantar fascial fibromatosis: Secondary | ICD-10-CM

## 2022-05-30 NOTE — Progress Notes (Signed)
  Subjective:  Patient ID: Misty Roberts, female    DOB: [birth year],  MRN: [id number]  Chief Complaint  Patient presents with   Plantar Fasciitis    Feet have started hurting again    66 y.o. female presents with the above complaint.  Patient reports that she has history of planter fasciitis.  She has previously had injection done in the left heel.  She has only had 1 injection in the past.  Says that helped a lot with her pain.  She is here to get another injection hopefully in both feet as both heels are hurting her and causing her pain when she stands and walks.  Pain is worse after getting out of bed in the morning but seems to her all day when she is walking.  She does not wear inserts for shoes at this time   Review of Systems: Negative except as noted in the HPI. Denies N/V/F/Ch.   Objective:  There were no vitals filed for this visit. There is no height or weight on file to calculate BMI. Constitutional Well developed. Well nourished.  Vascular Dorsalis pedis pulses palpable bilaterally. Posterior tibial pulses palpable bilaterally. Capillary refill normal to all digits.  No cyanosis or clubbing noted. Pedal hair growth normal.  Neurologic Normal speech. Oriented to person, place, and time. Epicritic sensation to light touch grossly present bilaterally.  Dermatologic Nails well groomed and normal in appearance. No open wounds. No skin lesions.  Orthopedic: Normal joint ROM without pain or crepitus bilaterally. No visible deformities. Tender to palpation at the calcaneal tuber bilaterally. No pain with calcaneal squeeze bilaterally. Ankle ROM diminished range of motion bilaterally. Silfverskiold Test: negative bilaterally.   Radiographs: Deferred at this visit  Assessment:   1. Plantar fasciitis of left foot   2. Plantar fasciitis of right foot    Plan:  Patient was evaluated and treated and all questions answered.  Plantar Fasciitis, bilaterally - XR  reviewed as above.  - Educated on icing and stretching. Instructions given.  - Injection delivered to the plantar fascia as below. - DME: Night splint dispensed. - Pharmacologic management: Meloxicam. Educated on risks/benefits and proper taking of medication.  Procedure: Injection Tendon/Ligament Location: Bilateral plantar fascia at the glabrous junction; medial approach. Skin Prep: alcohol Injectate: 1 cc 0.5% marcaine plain, 1 cc kenalog 10. Disposition: Patient tolerated procedure well. Injection site dressed with a band-aid.  Return if symptoms worsen or fail to improve.

## 2022-08-14 DIAGNOSIS — F419 Anxiety disorder, unspecified: Secondary | ICD-10-CM | POA: Diagnosis not present

## 2022-08-20 DIAGNOSIS — Z79899 Other long term (current) drug therapy: Secondary | ICD-10-CM | POA: Diagnosis not present

## 2022-08-20 DIAGNOSIS — E782 Mixed hyperlipidemia: Secondary | ICD-10-CM | POA: Diagnosis not present

## 2022-08-20 DIAGNOSIS — D518 Other vitamin B12 deficiency anemias: Secondary | ICD-10-CM | POA: Diagnosis not present

## 2022-08-20 DIAGNOSIS — E119 Type 2 diabetes mellitus without complications: Secondary | ICD-10-CM | POA: Diagnosis not present

## 2022-08-20 DIAGNOSIS — E038 Other specified hypothyroidism: Secondary | ICD-10-CM | POA: Diagnosis not present

## 2022-08-22 DIAGNOSIS — I10 Essential (primary) hypertension: Secondary | ICD-10-CM | POA: Diagnosis not present

## 2022-08-23 DIAGNOSIS — F331 Major depressive disorder, recurrent, moderate: Secondary | ICD-10-CM | POA: Diagnosis not present

## 2022-08-23 DIAGNOSIS — E782 Mixed hyperlipidemia: Secondary | ICD-10-CM | POA: Diagnosis not present

## 2022-08-23 DIAGNOSIS — F330 Major depressive disorder, recurrent, mild: Secondary | ICD-10-CM | POA: Diagnosis not present

## 2022-08-23 DIAGNOSIS — I10 Essential (primary) hypertension: Secondary | ICD-10-CM | POA: Diagnosis not present

## 2022-08-23 DIAGNOSIS — I4891 Unspecified atrial fibrillation: Secondary | ICD-10-CM | POA: Diagnosis not present

## 2022-08-23 DIAGNOSIS — E038 Other specified hypothyroidism: Secondary | ICD-10-CM | POA: Diagnosis not present

## 2022-08-23 DIAGNOSIS — N182 Chronic kidney disease, stage 2 (mild): Secondary | ICD-10-CM | POA: Diagnosis not present

## 2022-08-23 DIAGNOSIS — E559 Vitamin D deficiency, unspecified: Secondary | ICD-10-CM | POA: Diagnosis not present

## 2022-08-23 DIAGNOSIS — M150 Primary generalized (osteo)arthritis: Secondary | ICD-10-CM | POA: Diagnosis not present

## 2022-09-01 DIAGNOSIS — J019 Acute sinusitis, unspecified: Secondary | ICD-10-CM | POA: Diagnosis not present

## 2022-09-01 DIAGNOSIS — R0600 Dyspnea, unspecified: Secondary | ICD-10-CM | POA: Diagnosis not present

## 2022-09-01 DIAGNOSIS — G4733 Obstructive sleep apnea (adult) (pediatric): Secondary | ICD-10-CM | POA: Diagnosis not present

## 2022-09-01 DIAGNOSIS — J4540 Moderate persistent asthma, uncomplicated: Secondary | ICD-10-CM | POA: Diagnosis not present

## 2022-09-01 DIAGNOSIS — R5383 Other fatigue: Secondary | ICD-10-CM | POA: Diagnosis not present

## 2022-09-03 DIAGNOSIS — R051 Acute cough: Secondary | ICD-10-CM | POA: Diagnosis not present

## 2022-09-08 DIAGNOSIS — E559 Vitamin D deficiency, unspecified: Secondary | ICD-10-CM | POA: Diagnosis not present

## 2022-09-08 DIAGNOSIS — I10 Essential (primary) hypertension: Secondary | ICD-10-CM | POA: Diagnosis not present

## 2022-09-08 DIAGNOSIS — I4891 Unspecified atrial fibrillation: Secondary | ICD-10-CM | POA: Diagnosis not present

## 2022-09-08 DIAGNOSIS — M150 Primary generalized (osteo)arthritis: Secondary | ICD-10-CM | POA: Diagnosis not present

## 2022-09-08 DIAGNOSIS — E782 Mixed hyperlipidemia: Secondary | ICD-10-CM | POA: Diagnosis not present

## 2022-09-08 DIAGNOSIS — F330 Major depressive disorder, recurrent, mild: Secondary | ICD-10-CM | POA: Diagnosis not present

## 2022-09-08 DIAGNOSIS — E038 Other specified hypothyroidism: Secondary | ICD-10-CM | POA: Diagnosis not present

## 2022-09-08 DIAGNOSIS — F331 Major depressive disorder, recurrent, moderate: Secondary | ICD-10-CM | POA: Diagnosis not present

## 2022-09-08 DIAGNOSIS — N182 Chronic kidney disease, stage 2 (mild): Secondary | ICD-10-CM | POA: Diagnosis not present

## 2022-09-09 DIAGNOSIS — F411 Generalized anxiety disorder: Secondary | ICD-10-CM | POA: Diagnosis not present

## 2022-09-15 DIAGNOSIS — R5383 Other fatigue: Secondary | ICD-10-CM | POA: Diagnosis not present

## 2022-09-15 DIAGNOSIS — R0600 Dyspnea, unspecified: Secondary | ICD-10-CM | POA: Diagnosis not present

## 2022-09-15 DIAGNOSIS — J4540 Moderate persistent asthma, uncomplicated: Secondary | ICD-10-CM | POA: Diagnosis not present

## 2022-09-15 DIAGNOSIS — G4733 Obstructive sleep apnea (adult) (pediatric): Secondary | ICD-10-CM | POA: Diagnosis not present

## 2022-09-22 DIAGNOSIS — J449 Chronic obstructive pulmonary disease, unspecified: Secondary | ICD-10-CM | POA: Diagnosis not present

## 2022-10-08 DIAGNOSIS — R0600 Dyspnea, unspecified: Secondary | ICD-10-CM | POA: Diagnosis not present

## 2022-10-08 DIAGNOSIS — R918 Other nonspecific abnormal finding of lung field: Secondary | ICD-10-CM | POA: Diagnosis not present

## 2022-10-09 DIAGNOSIS — G8929 Other chronic pain: Secondary | ICD-10-CM | POA: Diagnosis not present

## 2022-10-09 DIAGNOSIS — M1711 Unilateral primary osteoarthritis, right knee: Secondary | ICD-10-CM | POA: Diagnosis not present

## 2022-10-09 DIAGNOSIS — N1831 Chronic kidney disease, stage 3a: Secondary | ICD-10-CM | POA: Diagnosis not present

## 2022-10-09 DIAGNOSIS — I129 Hypertensive chronic kidney disease with stage 1 through stage 4 chronic kidney disease, or unspecified chronic kidney disease: Secondary | ICD-10-CM | POA: Diagnosis not present

## 2022-10-09 DIAGNOSIS — M25561 Pain in right knee: Secondary | ICD-10-CM | POA: Diagnosis not present

## 2022-10-09 DIAGNOSIS — M25562 Pain in left knee: Secondary | ICD-10-CM | POA: Diagnosis not present

## 2022-10-15 DIAGNOSIS — I10 Essential (primary) hypertension: Secondary | ICD-10-CM | POA: Diagnosis not present

## 2022-10-15 DIAGNOSIS — E038 Other specified hypothyroidism: Secondary | ICD-10-CM | POA: Diagnosis not present

## 2022-10-15 DIAGNOSIS — I4891 Unspecified atrial fibrillation: Secondary | ICD-10-CM | POA: Diagnosis not present

## 2022-10-15 DIAGNOSIS — E782 Mixed hyperlipidemia: Secondary | ICD-10-CM | POA: Diagnosis not present

## 2022-10-15 DIAGNOSIS — M150 Primary generalized (osteo)arthritis: Secondary | ICD-10-CM | POA: Diagnosis not present

## 2022-10-15 DIAGNOSIS — F331 Major depressive disorder, recurrent, moderate: Secondary | ICD-10-CM | POA: Diagnosis not present

## 2022-10-15 DIAGNOSIS — E559 Vitamin D deficiency, unspecified: Secondary | ICD-10-CM | POA: Diagnosis not present

## 2022-10-15 DIAGNOSIS — N182 Chronic kidney disease, stage 2 (mild): Secondary | ICD-10-CM | POA: Diagnosis not present

## 2022-10-15 DIAGNOSIS — F330 Major depressive disorder, recurrent, mild: Secondary | ICD-10-CM | POA: Diagnosis not present

## 2022-10-17 DIAGNOSIS — M5106 Intervertebral disc disorders with myelopathy, lumbar region: Secondary | ICD-10-CM | POA: Diagnosis not present

## 2022-10-17 DIAGNOSIS — M5116 Intervertebral disc disorders with radiculopathy, lumbar region: Secondary | ICD-10-CM | POA: Diagnosis not present

## 2022-10-20 DIAGNOSIS — G4733 Obstructive sleep apnea (adult) (pediatric): Secondary | ICD-10-CM | POA: Diagnosis not present

## 2022-10-20 DIAGNOSIS — J4540 Moderate persistent asthma, uncomplicated: Secondary | ICD-10-CM | POA: Diagnosis not present

## 2022-10-20 DIAGNOSIS — R0600 Dyspnea, unspecified: Secondary | ICD-10-CM | POA: Diagnosis not present

## 2022-10-20 DIAGNOSIS — R918 Other nonspecific abnormal finding of lung field: Secondary | ICD-10-CM | POA: Diagnosis not present

## 2022-10-20 DIAGNOSIS — R5383 Other fatigue: Secondary | ICD-10-CM | POA: Diagnosis not present

## 2022-10-23 DIAGNOSIS — I10 Essential (primary) hypertension: Secondary | ICD-10-CM | POA: Diagnosis not present

## 2022-10-31 DIAGNOSIS — F419 Anxiety disorder, unspecified: Secondary | ICD-10-CM | POA: Diagnosis not present

## 2022-11-05 DIAGNOSIS — R42 Dizziness and giddiness: Secondary | ICD-10-CM | POA: Diagnosis not present

## 2022-11-05 DIAGNOSIS — Z0000 Encounter for general adult medical examination without abnormal findings: Secondary | ICD-10-CM | POA: Diagnosis not present

## 2022-11-05 DIAGNOSIS — J449 Chronic obstructive pulmonary disease, unspecified: Secondary | ICD-10-CM | POA: Diagnosis not present

## 2022-11-05 DIAGNOSIS — E782 Mixed hyperlipidemia: Secondary | ICD-10-CM | POA: Diagnosis not present

## 2022-11-05 DIAGNOSIS — Z79899 Other long term (current) drug therapy: Secondary | ICD-10-CM | POA: Diagnosis not present

## 2022-11-05 DIAGNOSIS — E559 Vitamin D deficiency, unspecified: Secondary | ICD-10-CM | POA: Diagnosis not present

## 2022-11-05 DIAGNOSIS — I10 Essential (primary) hypertension: Secondary | ICD-10-CM | POA: Diagnosis not present

## 2022-11-05 DIAGNOSIS — G629 Polyneuropathy, unspecified: Secondary | ICD-10-CM | POA: Diagnosis not present

## 2022-11-05 DIAGNOSIS — J309 Allergic rhinitis, unspecified: Secondary | ICD-10-CM | POA: Diagnosis not present

## 2022-11-11 DIAGNOSIS — N182 Chronic kidney disease, stage 2 (mild): Secondary | ICD-10-CM | POA: Diagnosis not present

## 2022-11-11 DIAGNOSIS — M5136 Other intervertebral disc degeneration, lumbar region: Secondary | ICD-10-CM | POA: Diagnosis not present

## 2022-11-11 DIAGNOSIS — M47816 Spondylosis without myelopathy or radiculopathy, lumbar region: Secondary | ICD-10-CM | POA: Diagnosis not present

## 2022-11-11 DIAGNOSIS — G894 Chronic pain syndrome: Secondary | ICD-10-CM | POA: Diagnosis not present

## 2022-11-11 DIAGNOSIS — F330 Major depressive disorder, recurrent, mild: Secondary | ICD-10-CM | POA: Diagnosis not present

## 2022-11-11 DIAGNOSIS — F411 Generalized anxiety disorder: Secondary | ICD-10-CM | POA: Diagnosis not present

## 2022-11-11 DIAGNOSIS — F331 Major depressive disorder, recurrent, moderate: Secondary | ICD-10-CM | POA: Diagnosis not present

## 2022-11-11 DIAGNOSIS — E038 Other specified hypothyroidism: Secondary | ICD-10-CM | POA: Diagnosis not present

## 2022-11-11 DIAGNOSIS — E782 Mixed hyperlipidemia: Secondary | ICD-10-CM | POA: Diagnosis not present

## 2022-11-11 DIAGNOSIS — I10 Essential (primary) hypertension: Secondary | ICD-10-CM | POA: Diagnosis not present

## 2022-11-11 DIAGNOSIS — I4891 Unspecified atrial fibrillation: Secondary | ICD-10-CM | POA: Diagnosis not present

## 2022-11-11 DIAGNOSIS — M5416 Radiculopathy, lumbar region: Secondary | ICD-10-CM | POA: Diagnosis not present

## 2022-11-11 DIAGNOSIS — E559 Vitamin D deficiency, unspecified: Secondary | ICD-10-CM | POA: Diagnosis not present

## 2022-11-17 DIAGNOSIS — G4733 Obstructive sleep apnea (adult) (pediatric): Secondary | ICD-10-CM | POA: Diagnosis not present

## 2022-11-17 DIAGNOSIS — R918 Other nonspecific abnormal finding of lung field: Secondary | ICD-10-CM | POA: Diagnosis not present

## 2022-11-17 DIAGNOSIS — R0600 Dyspnea, unspecified: Secondary | ICD-10-CM | POA: Diagnosis not present

## 2022-11-17 DIAGNOSIS — J4540 Moderate persistent asthma, uncomplicated: Secondary | ICD-10-CM | POA: Diagnosis not present

## 2022-11-17 DIAGNOSIS — R5383 Other fatigue: Secondary | ICD-10-CM | POA: Diagnosis not present

## 2022-11-18 DIAGNOSIS — K5909 Other constipation: Secondary | ICD-10-CM | POA: Diagnosis not present

## 2022-11-18 DIAGNOSIS — R339 Retention of urine, unspecified: Secondary | ICD-10-CM | POA: Diagnosis not present

## 2022-11-18 DIAGNOSIS — N390 Urinary tract infection, site not specified: Secondary | ICD-10-CM | POA: Diagnosis not present

## 2022-11-18 DIAGNOSIS — N952 Postmenopausal atrophic vaginitis: Secondary | ICD-10-CM | POA: Diagnosis not present

## 2022-11-20 DIAGNOSIS — Z13820 Encounter for screening for osteoporosis: Secondary | ICD-10-CM | POA: Diagnosis not present

## 2022-11-20 DIAGNOSIS — M8589 Other specified disorders of bone density and structure, multiple sites: Secondary | ICD-10-CM | POA: Diagnosis not present

## 2022-11-21 DIAGNOSIS — I10 Essential (primary) hypertension: Secondary | ICD-10-CM | POA: Diagnosis not present

## 2022-11-26 DIAGNOSIS — F419 Anxiety disorder, unspecified: Secondary | ICD-10-CM | POA: Diagnosis not present

## 2022-11-28 DIAGNOSIS — F411 Generalized anxiety disorder: Secondary | ICD-10-CM | POA: Diagnosis not present

## 2022-11-28 DIAGNOSIS — N182 Chronic kidney disease, stage 2 (mild): Secondary | ICD-10-CM | POA: Diagnosis not present

## 2022-11-28 DIAGNOSIS — I4891 Unspecified atrial fibrillation: Secondary | ICD-10-CM | POA: Diagnosis not present

## 2022-11-28 DIAGNOSIS — E782 Mixed hyperlipidemia: Secondary | ICD-10-CM | POA: Diagnosis not present

## 2022-11-28 DIAGNOSIS — F331 Major depressive disorder, recurrent, moderate: Secondary | ICD-10-CM | POA: Diagnosis not present

## 2022-11-28 DIAGNOSIS — E559 Vitamin D deficiency, unspecified: Secondary | ICD-10-CM | POA: Diagnosis not present

## 2022-11-28 DIAGNOSIS — I10 Essential (primary) hypertension: Secondary | ICD-10-CM | POA: Diagnosis not present

## 2022-11-28 DIAGNOSIS — F330 Major depressive disorder, recurrent, mild: Secondary | ICD-10-CM | POA: Diagnosis not present

## 2022-11-28 DIAGNOSIS — E038 Other specified hypothyroidism: Secondary | ICD-10-CM | POA: Diagnosis not present

## 2022-12-15 DIAGNOSIS — G4733 Obstructive sleep apnea (adult) (pediatric): Secondary | ICD-10-CM | POA: Diagnosis not present

## 2022-12-15 DIAGNOSIS — R918 Other nonspecific abnormal finding of lung field: Secondary | ICD-10-CM | POA: Diagnosis not present

## 2022-12-15 DIAGNOSIS — R5383 Other fatigue: Secondary | ICD-10-CM | POA: Diagnosis not present

## 2022-12-15 DIAGNOSIS — J4540 Moderate persistent asthma, uncomplicated: Secondary | ICD-10-CM | POA: Diagnosis not present

## 2022-12-15 DIAGNOSIS — Z711 Person with feared health complaint in whom no diagnosis is made: Secondary | ICD-10-CM | POA: Diagnosis not present

## 2022-12-15 DIAGNOSIS — R0600 Dyspnea, unspecified: Secondary | ICD-10-CM | POA: Diagnosis not present

## 2022-12-22 DIAGNOSIS — I10 Essential (primary) hypertension: Secondary | ICD-10-CM | POA: Diagnosis not present

## 2022-12-24 DIAGNOSIS — H04203 Unspecified epiphora, bilateral lacrimal glands: Secondary | ICD-10-CM | POA: Diagnosis not present

## 2022-12-24 DIAGNOSIS — H25813 Combined forms of age-related cataract, bilateral: Secondary | ICD-10-CM | POA: Diagnosis not present

## 2022-12-26 DIAGNOSIS — E559 Vitamin D deficiency, unspecified: Secondary | ICD-10-CM | POA: Diagnosis not present

## 2022-12-26 DIAGNOSIS — I10 Essential (primary) hypertension: Secondary | ICD-10-CM | POA: Diagnosis not present

## 2022-12-26 DIAGNOSIS — F331 Major depressive disorder, recurrent, moderate: Secondary | ICD-10-CM | POA: Diagnosis not present

## 2022-12-26 DIAGNOSIS — I4891 Unspecified atrial fibrillation: Secondary | ICD-10-CM | POA: Diagnosis not present

## 2022-12-26 DIAGNOSIS — F330 Major depressive disorder, recurrent, mild: Secondary | ICD-10-CM | POA: Diagnosis not present

## 2022-12-26 DIAGNOSIS — E038 Other specified hypothyroidism: Secondary | ICD-10-CM | POA: Diagnosis not present

## 2022-12-26 DIAGNOSIS — F411 Generalized anxiety disorder: Secondary | ICD-10-CM | POA: Diagnosis not present

## 2022-12-26 DIAGNOSIS — N182 Chronic kidney disease, stage 2 (mild): Secondary | ICD-10-CM | POA: Diagnosis not present

## 2022-12-26 DIAGNOSIS — E782 Mixed hyperlipidemia: Secondary | ICD-10-CM | POA: Diagnosis not present

## 2022-12-31 DIAGNOSIS — I10 Essential (primary) hypertension: Secondary | ICD-10-CM | POA: Diagnosis not present

## 2022-12-31 DIAGNOSIS — J449 Chronic obstructive pulmonary disease, unspecified: Secondary | ICD-10-CM | POA: Diagnosis not present

## 2022-12-31 DIAGNOSIS — E119 Type 2 diabetes mellitus without complications: Secondary | ICD-10-CM | POA: Diagnosis not present

## 2022-12-31 DIAGNOSIS — R42 Dizziness and giddiness: Secondary | ICD-10-CM | POA: Diagnosis not present

## 2022-12-31 DIAGNOSIS — E559 Vitamin D deficiency, unspecified: Secondary | ICD-10-CM | POA: Diagnosis not present

## 2022-12-31 DIAGNOSIS — E782 Mixed hyperlipidemia: Secondary | ICD-10-CM | POA: Diagnosis not present

## 2022-12-31 DIAGNOSIS — K219 Gastro-esophageal reflux disease without esophagitis: Secondary | ICD-10-CM | POA: Diagnosis not present

## 2023-01-01 DIAGNOSIS — Z79899 Other long term (current) drug therapy: Secondary | ICD-10-CM | POA: Diagnosis not present

## 2023-01-01 DIAGNOSIS — E038 Other specified hypothyroidism: Secondary | ICD-10-CM | POA: Diagnosis not present

## 2023-01-01 DIAGNOSIS — D518 Other vitamin B12 deficiency anemias: Secondary | ICD-10-CM | POA: Diagnosis not present

## 2023-01-01 DIAGNOSIS — E782 Mixed hyperlipidemia: Secondary | ICD-10-CM | POA: Diagnosis not present

## 2023-01-01 DIAGNOSIS — E119 Type 2 diabetes mellitus without complications: Secondary | ICD-10-CM | POA: Diagnosis not present

## 2023-01-09 DIAGNOSIS — Z955 Presence of coronary angioplasty implant and graft: Secondary | ICD-10-CM | POA: Diagnosis not present

## 2023-01-09 DIAGNOSIS — E785 Hyperlipidemia, unspecified: Secondary | ICD-10-CM | POA: Diagnosis not present

## 2023-01-09 DIAGNOSIS — I2510 Atherosclerotic heart disease of native coronary artery without angina pectoris: Secondary | ICD-10-CM | POA: Diagnosis not present

## 2023-01-09 DIAGNOSIS — F419 Anxiety disorder, unspecified: Secondary | ICD-10-CM | POA: Diagnosis not present

## 2023-01-09 DIAGNOSIS — R5381 Other malaise: Secondary | ICD-10-CM | POA: Diagnosis not present

## 2023-01-09 DIAGNOSIS — R5383 Other fatigue: Secondary | ICD-10-CM | POA: Diagnosis not present

## 2023-01-09 DIAGNOSIS — R079 Chest pain, unspecified: Secondary | ICD-10-CM | POA: Diagnosis not present

## 2023-01-09 DIAGNOSIS — I10 Essential (primary) hypertension: Secondary | ICD-10-CM | POA: Diagnosis not present

## 2023-01-09 DIAGNOSIS — R0609 Other forms of dyspnea: Secondary | ICD-10-CM | POA: Diagnosis not present

## 2023-01-20 DIAGNOSIS — N390 Urinary tract infection, site not specified: Secondary | ICD-10-CM | POA: Diagnosis not present

## 2023-01-20 DIAGNOSIS — R339 Retention of urine, unspecified: Secondary | ICD-10-CM | POA: Diagnosis not present

## 2023-01-20 DIAGNOSIS — N952 Postmenopausal atrophic vaginitis: Secondary | ICD-10-CM | POA: Diagnosis not present

## 2023-01-21 DIAGNOSIS — I10 Essential (primary) hypertension: Secondary | ICD-10-CM | POA: Diagnosis not present

## 2023-01-27 DIAGNOSIS — N3091 Cystitis, unspecified with hematuria: Secondary | ICD-10-CM | POA: Diagnosis not present

## 2023-01-27 DIAGNOSIS — R531 Weakness: Secondary | ICD-10-CM | POA: Diagnosis not present

## 2023-01-27 DIAGNOSIS — R064 Hyperventilation: Secondary | ICD-10-CM | POA: Diagnosis not present

## 2023-01-27 DIAGNOSIS — N3000 Acute cystitis without hematuria: Secondary | ICD-10-CM | POA: Diagnosis not present

## 2023-01-27 DIAGNOSIS — R42 Dizziness and giddiness: Secondary | ICD-10-CM | POA: Diagnosis not present

## 2023-02-02 DIAGNOSIS — R079 Chest pain, unspecified: Secondary | ICD-10-CM | POA: Diagnosis not present

## 2023-02-04 DIAGNOSIS — F411 Generalized anxiety disorder: Secondary | ICD-10-CM | POA: Diagnosis not present

## 2023-02-04 DIAGNOSIS — N182 Chronic kidney disease, stage 2 (mild): Secondary | ICD-10-CM | POA: Diagnosis not present

## 2023-02-04 DIAGNOSIS — E038 Other specified hypothyroidism: Secondary | ICD-10-CM | POA: Diagnosis not present

## 2023-02-04 DIAGNOSIS — F330 Major depressive disorder, recurrent, mild: Secondary | ICD-10-CM | POA: Diagnosis not present

## 2023-02-04 DIAGNOSIS — E782 Mixed hyperlipidemia: Secondary | ICD-10-CM | POA: Diagnosis not present

## 2023-02-04 DIAGNOSIS — F331 Major depressive disorder, recurrent, moderate: Secondary | ICD-10-CM | POA: Diagnosis not present

## 2023-02-04 DIAGNOSIS — I4891 Unspecified atrial fibrillation: Secondary | ICD-10-CM | POA: Diagnosis not present

## 2023-02-04 DIAGNOSIS — E559 Vitamin D deficiency, unspecified: Secondary | ICD-10-CM | POA: Diagnosis not present

## 2023-02-04 DIAGNOSIS — I10 Essential (primary) hypertension: Secondary | ICD-10-CM | POA: Diagnosis not present

## 2023-02-06 DIAGNOSIS — M5136 Other intervertebral disc degeneration, lumbar region: Secondary | ICD-10-CM | POA: Diagnosis not present

## 2023-02-06 DIAGNOSIS — M4722 Other spondylosis with radiculopathy, cervical region: Secondary | ICD-10-CM | POA: Diagnosis not present

## 2023-02-10 DIAGNOSIS — F419 Anxiety disorder, unspecified: Secondary | ICD-10-CM | POA: Diagnosis not present

## 2023-02-17 DIAGNOSIS — M5116 Intervertebral disc disorders with radiculopathy, lumbar region: Secondary | ICD-10-CM | POA: Diagnosis not present

## 2023-02-21 DIAGNOSIS — I10 Essential (primary) hypertension: Secondary | ICD-10-CM | POA: Diagnosis not present
